# Patient Record
Sex: Male | Born: 1945 | Race: Black or African American | Hispanic: No | Marital: Single | Smoking: Never smoker
Health system: Southern US, Community
[De-identification: ages and names within clinical notes are randomized; demographics above are authoritative.]

## PROBLEM LIST (undated history)

## (undated) DIAGNOSIS — C61 Malignant neoplasm of prostate: Secondary | ICD-10-CM

## (undated) DIAGNOSIS — I10 Essential (primary) hypertension: Secondary | ICD-10-CM

## (undated) DIAGNOSIS — M545 Low back pain, unspecified: Secondary | ICD-10-CM

## (undated) HISTORY — DX: Low back pain, unspecified: M54.50

## (undated) HISTORY — PX: PROSTATE SURGERY: SHX751

## (undated) HISTORY — DX: Essential (primary) hypertension: I10

## (undated) HISTORY — DX: Malignant neoplasm of prostate: C61

## (undated) HISTORY — PX: HEMORROIDECTOMY: SUR656

---
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
## 2018-11-09 ENCOUNTER — Other Ambulatory Visit: Payer: Self-pay | Admitting: Urology

## 2018-11-09 DIAGNOSIS — C61 Malignant neoplasm of prostate: Secondary | ICD-10-CM

## 2018-12-04 ENCOUNTER — Ambulatory Visit
Admission: RE | Admit: 2018-12-04 | Discharge: 2018-12-04 | Disposition: A | Discharge status: Home / Self Care | Payer: Medicare Other | Source: Ambulatory Visit | Attending: Urology | Admitting: Urology

## 2018-12-04 ENCOUNTER — Other Ambulatory Visit: Payer: Self-pay

## 2018-12-04 DIAGNOSIS — C61 Malignant neoplasm of prostate: Secondary | ICD-10-CM

## 2018-12-04 MED ORDER — GADOBENATE DIMEGLUMINE 529 MG/ML IV SOLN
20.0000 mL | Freq: Once | INTRAVENOUS | Status: AC | PRN
  Administered 2018-12-04: 20 mL via INTRAVENOUS

## 2019-05-20 ENCOUNTER — Other Ambulatory Visit (HOSPITAL_COMMUNITY): Payer: Self-pay | Admitting: Urology

## 2019-05-20 DIAGNOSIS — C61 Malignant neoplasm of prostate: Secondary | ICD-10-CM

## 2019-06-03 ENCOUNTER — Other Ambulatory Visit: Payer: Self-pay

## 2019-06-03 ENCOUNTER — Ambulatory Visit (HOSPITAL_COMMUNITY)
Admission: RE | Admit: 2019-06-03 | Discharge: 2019-06-03 | Disposition: A | Discharge status: Home / Self Care | Payer: Medicare Other | Source: Ambulatory Visit | Attending: Urology | Admitting: Urology

## 2019-06-03 DIAGNOSIS — C61 Malignant neoplasm of prostate: Secondary | ICD-10-CM | POA: Diagnosis not present

## 2019-06-03 IMAGING — CT NM PET SKULL BASE TO THIGH
8 series · 25 of 25 positions shown · non-contrast
Comparison: Prostate MRI [DATE]

CLINICAL DATA: Prostate carcinoma with biochemical recurrence.

EXAM:
NUCLEAR MEDICINE PET SKULL BASE TO THIGH
TECHNIQUE: 10.21 mCi F-18 Fluciclovine was injected intravenously. Full-ring
PET imaging was performed from the skull base to thigh after the
radiotracer. CT data was obtained and used for attenuation
correction and anatomic localization.

[Series 3: pet sk_thigh ac · axial · 5.0mm · 4.07mm/px · z∈[+388,+1412]mm · 6 of 257 slices shown]
[im 1/257]
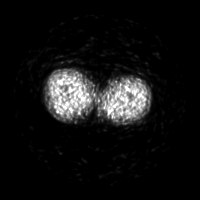
[im 52/257]
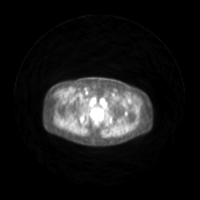
[im 103/257]
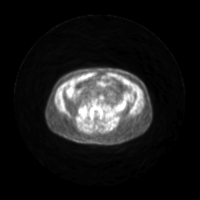
[im 154/257]
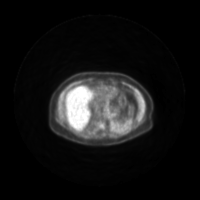
[im 205/257]
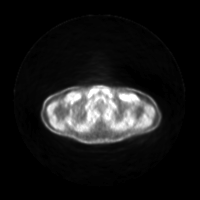
[im 257/257]
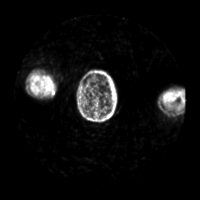

[Series 4: ct sk_thigh 5.0 hd_fov · axial · 5.0mm · 1.27mm/px · z∈[+388,+1412]mm · 5 of 257 slices shown]
[im 1/257]
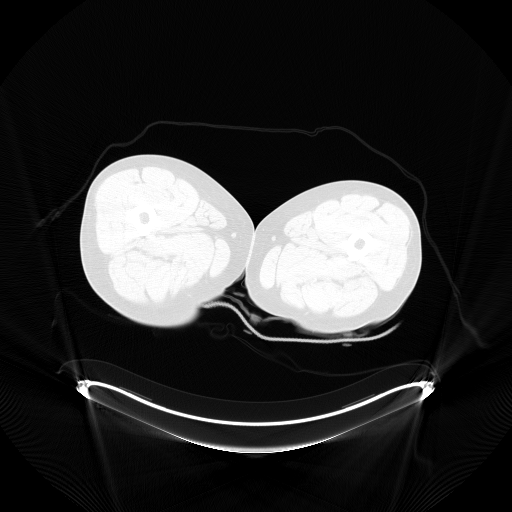
[im 65/257]
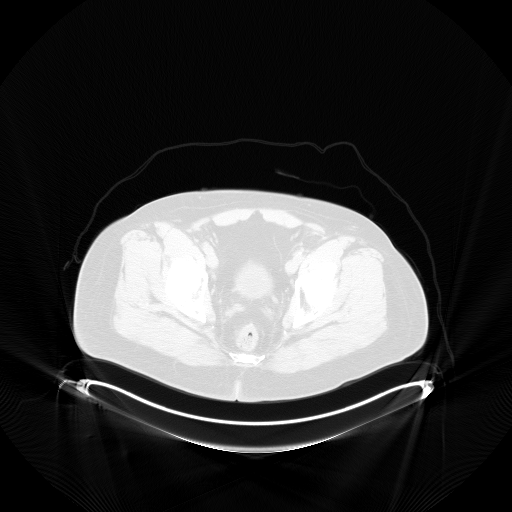
[im 129/257]
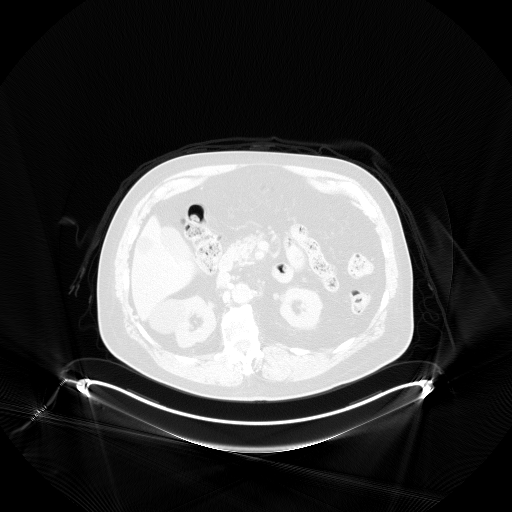
[im 193/257]
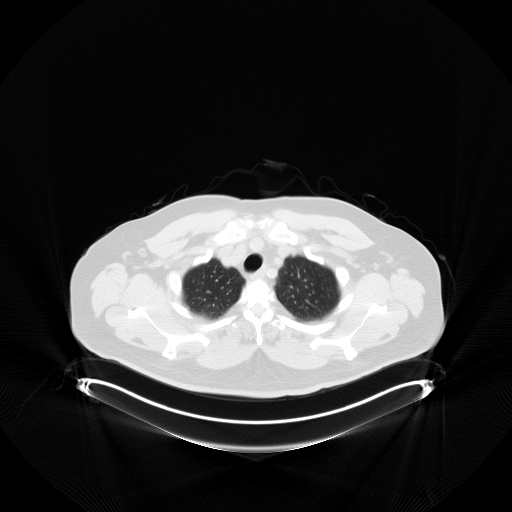
[im 257/257]
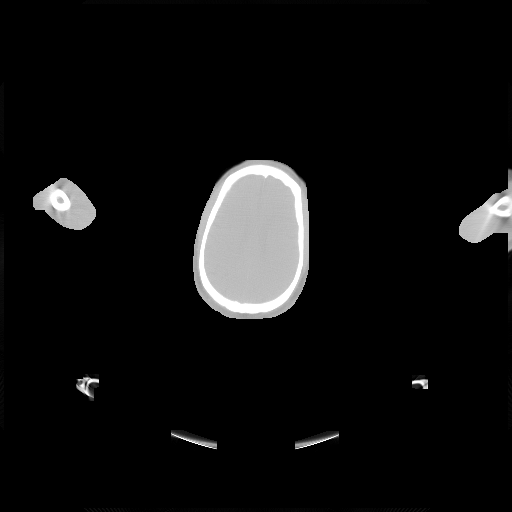

[Series 5: pet sk_thigh nac · axial · 5.0mm · 4.07mm/px · z∈[+388,+1412]mm · 5 of 257 slices shown]
[im 1/257]
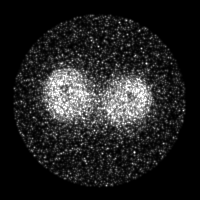
[im 65/257]
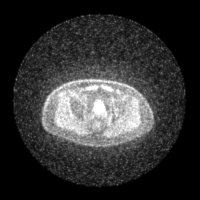
[im 129/257]
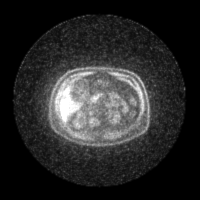
[im 193/257]
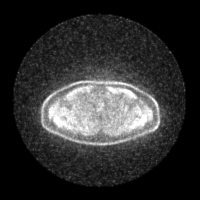
[im 257/257]
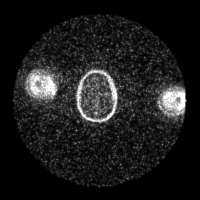

[Series 8: ct sk_thigh 5.0 (id) lung_bone · axial · 5.0mm · 0.70mm/px · 1 of 65 slices shown]
[im 1/65  bone]
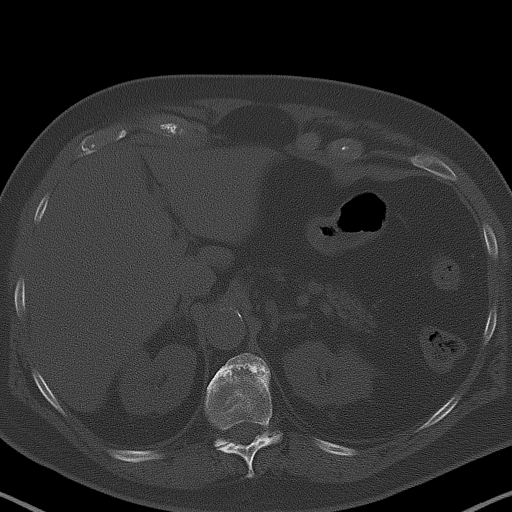

[Series 603: mip range 3 · coronal · 2.12mm/px · 1 of 32 slices shown]
[im 1/32]
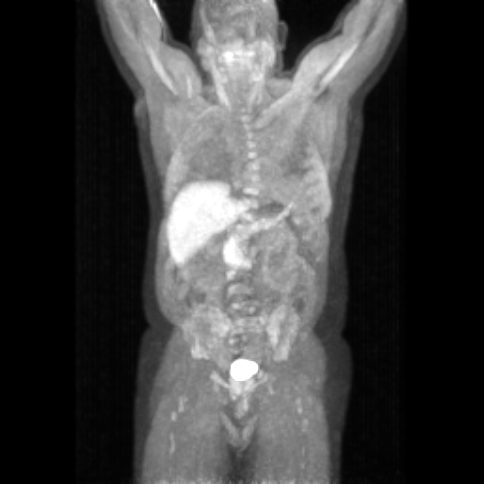

[Series 604: range-ct sk_thigh 5.0 hd_fov-cor-<alpha range> · 1 of 57 slices shown]
[im 1/57]
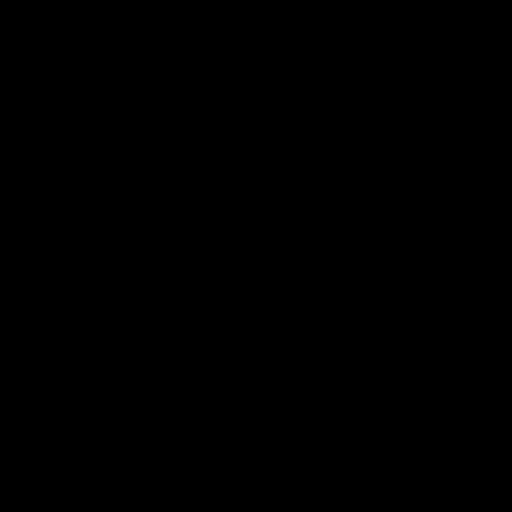

[Series 605: range-ct sk_thigh 5.0 hd_fov-tra-<alpha range> · 5 of 248 slices shown]
[im 1/248]
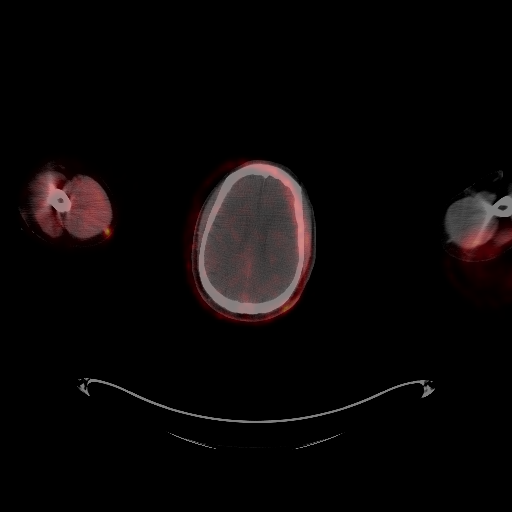
[im 62/248]
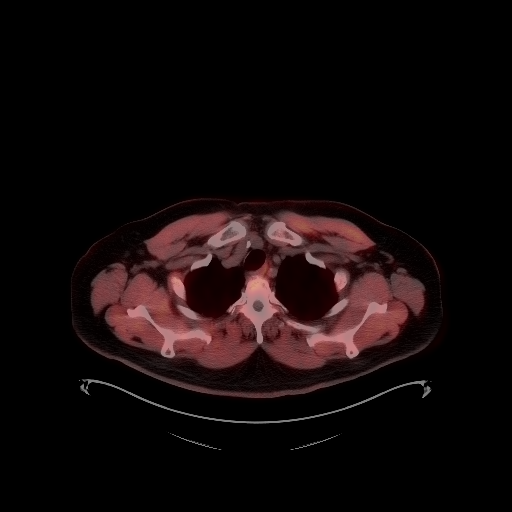
[im 124/248]
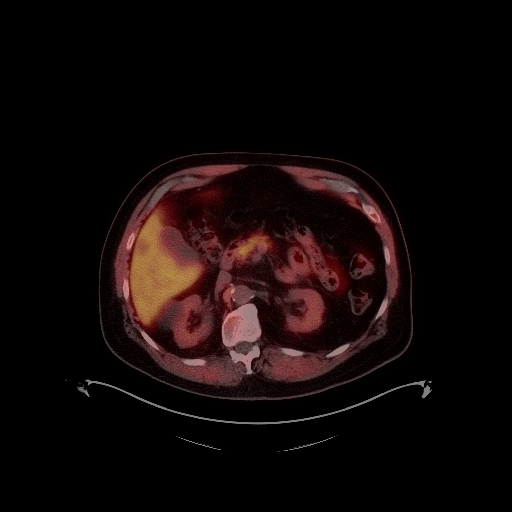
[im 186/248]
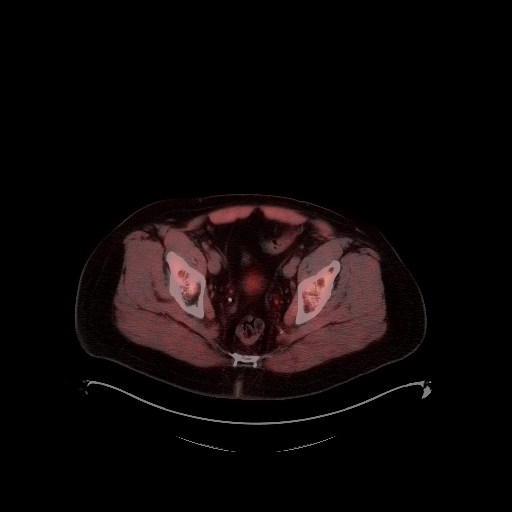
[im 248/248]
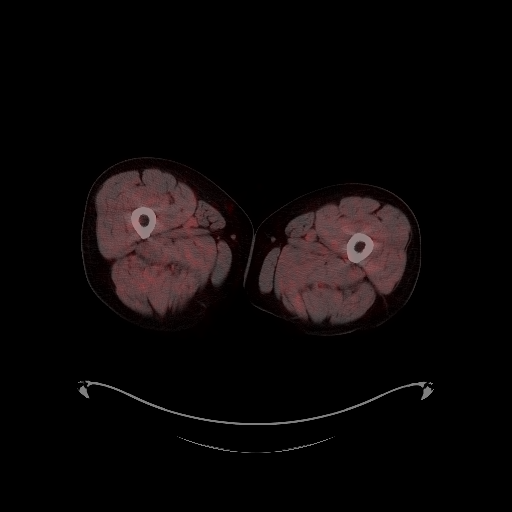

[Series 1100: results mm oncology reading · 1.3mm · 1.16mm/px · 1 of 2 slices shown]
[im 1/2]
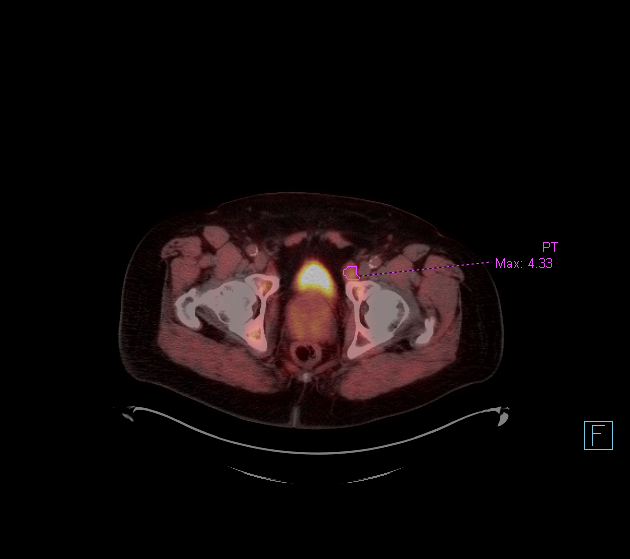

[25 of 25 positions shown; findings below may reference images not displayed]

FINDINGS: NECK

No radiotracer activity in neck lymph nodes.

Incidental CT finding: None

CHEST

No radiotracer accumulation within mediastinal or hilar lymph nodes.
No suspicious pulmonary nodules on the CT scan.

Incidental CT finding: There is atelectasis in the LEFT lower lobe.
Chronic elevation of the LEFT hemidiaphragm.

ABDOMEN/PELVIS

Prostate: No clear focal increased radiotracer activity in the
prostate gland.

Lymph nodes: The enlarged LEFT external iliac lymph node anterior to
the acetabulum measures 17 mm (image 20/14)and has relatively mild
radiotracer activity (SUV max equal 4.3). This activity is less than
background marrow activity (SUV max equal 5.4). RIGHT external iliac
lymph node measuring 12 mm (image 199/4) has similar low radiotracer
activity.

Small proximal LEFT external iliac lymph node measuring 6 mm
(185/)without significant radiotracer activity.

No abnormal activity associated small periaortic lymph nodes.

Liver: No evidence of liver metastasis. Benign cyst in the inferior
RIGHT hepatic lobe

Incidental CT finding: Prostate is enlarged measuring 16 mm in
diameter.

SKELETON

No focal  activity to suggest skeletal metastasis.
IMPRESSION: 1. No convincing evidence prostate carcinoma.
2. Mild radiotracer activity associated external iliac lymph nodes
is favored reactive.
3. No evidence of metastatic adenopathy in the abdomen.
4. No liver metastasis or pulmonary metastasis.
5. Benign hepatic cyst and renal cysts.
6. No skeletal metastasis.

## 2019-06-03 MED ORDER — AXUMIN (FLUCICLOVINE F 18) INJECTION
10.2100 | Freq: Once | INTRAVENOUS | Status: AC
  Administered 2019-06-03: 10.21 via INTRAVENOUS

## 2019-09-16 ENCOUNTER — Encounter: Payer: Self-pay | Admitting: Physical Therapy

## 2019-09-16 ENCOUNTER — Other Ambulatory Visit: Payer: Self-pay

## 2019-09-16 ENCOUNTER — Ambulatory Visit: Payer: Medicare Other | Attending: Neurology | Admitting: Physical Therapy

## 2019-09-16 DIAGNOSIS — M545 Low back pain: Secondary | ICD-10-CM | POA: Insufficient documentation

## 2019-09-16 DIAGNOSIS — Z9181 History of falling: Secondary | ICD-10-CM | POA: Insufficient documentation

## 2019-09-16 DIAGNOSIS — G8929 Other chronic pain: Secondary | ICD-10-CM | POA: Diagnosis present

## 2019-09-16 DIAGNOSIS — R252 Cramp and spasm: Secondary | ICD-10-CM | POA: Insufficient documentation

## 2019-09-16 DIAGNOSIS — M6281 Muscle weakness (generalized): Secondary | ICD-10-CM | POA: Diagnosis not present

## 2019-09-16 NOTE — Therapy (Signed)
[address] [place], [place], [address] Phone: [phone]   Fax:  [phone]  Physical Therapy Evaluation  Patient Details  Name: Frederick Kemp. [place] MRN: [id number] Date of Birth: November 05, 1945 Referring Provider (PT): Nolene Ebbs   Encounter Date: 09/16/2019  PT End of Session - 09/16/19 1610    Visit Number  1    Date for PT Re-Evaluation  11/16/19    PT Start Time  [id number]    PT Stop Time  1530    PT Time Calculation (min)  45 min    Activity Tolerance  Patient tolerated treatment well    Behavior During Therapy  [facility] for tasks assessed/performed       Past Medical History:  Diagnosis Date  . HTN (hypertension)   . Low back pain   . Prostate cancer [facility])     Past Surgical History:  Procedure Laterality Date  . HEMORROIDECTOMY    . PROSTATE SURGERY      There were no vitals filed for this visit.   Subjective Assessment - 09/16/19 1446    Subjective  Pt reports that in early April he was doing his speed walking routine and his LLE was not keeping up with RLE. Pt had MRI which showed two strokes present; since this incident pt notes that he feels that he is weaker on L side. Pt reports he is moving slower, getting up slower, and feeling off balance.    Pertinent History  HTN    Limitations  Lifting;Standing;Walking;House hold activities    Currently in Pain?  Yes    Pain Score  8     Pain Location  Back    Pain Orientation  Right;Lower    Pain Descriptors / Indicators  Aching;Radiating    Pain Type  Chronic pain    Pain Radiating Towards  R LE    Pain Onset  More than a month ago    Pain Frequency  Intermittent    Aggravating Factors   bending, lifting, standing    Pain Relieving Factors  heat, chiropractor         [facility] PT Assessment - 09/16/19 0001      Assessment   Medical Diagnosis  CVA with L sided weakness    Referring Provider (PT)  Nolene Ebbs    Onset Date/Surgical Date   07/05/19    Prior Therapy  None      Precautions   Precautions  None      Restrictions   Weight Bearing Restrictions  No      Balance Screen   Has the patient fallen in the past 6 months  Yes    How many times?  1   reaching to get something out of bed and fell on the floor   Has the patient had a decrease in activity level because of a fear of falling?   Yes    Is the patient reluctant to leave their home because of a fear of falling?   No      Home Environment   Additional Comments  no stairs; reports no problems with stairs      Prior Function   Level of Independence  Independent    Leisure  walking      Sensation   Additional Comments  mildly impaired sense of L v R      Coordination   Gross Motor Movements are Fluid and Coordinated  No   mildly  impaired coordination on L     Functional Tests   Functional tests  Sit to Stand      Sit to Stand   Comments  leaning slightly on RLE      Tone   Assessment Location  --   no tone noted     ROM / Strength   AROM / PROM / Strength  AROM;Strength      AROM   Overall AROM Comments  WFL LE/UE      Strength   Overall Strength Comments  LLE strength 4-/5 globally; RLE strength 5/5 globally; LUE strength 4/5 RUE strength 5/5      Flexibility   Soft Tissue Assessment /Muscle Length  yes    Hamstrings  very limited; pt reports cramping    Quadriceps  limited and painful      Special Tests   Other special tests  negative for clonus B      Ambulation/Gait   Gait Comments  some veering to L when walking with gait                  Objective measurements completed on examination: See above findings.      [facility] Adult PT Treatment/Exercise - 09/16/19 0001      Exercises   Exercises  Lumbar;Knee/Hip      Lumbar Exercises: Stretches   Active Hamstring Stretch  Right;Left;1 rep;30 seconds      Lumbar Exercises: Aerobic   Nustep  L5 x 10 min      Lumbar Exercises: Standing   Heel Raises  10 reps     Other Standing Lumbar Exercises  wall pushup x10      Knee/Hip Exercises: Standing   Hip Abduction  Stengthening;Both;1 set;10 reps      Knee/Hip Exercises: Seated   Marching  Both;1 set;10 reps             PT Education - 09/16/19 1609    Education Details  Pt educated on condition, rehab process, and HEP    Person(s) Educated  Patient    Methods  Explanation;Demonstration;Handout    Comprehension  Verbalized understanding;Returned demonstration       PT Short Term Goals - 09/16/19 1636      PT SHORT TERM GOAL #1   Title  Pt will be independent with HEP    Time  2    Period  Weeks    Status  New    Target Date  09/30/19        PT Long Term Goals - 09/16/19 1636      PT LONG TERM GOAL #1   Title  Pt will demonstrate LLE MMT equivalent to RLE MMT    Time  8    Status  New    Target Date  11/11/19      PT LONG TERM GOAL #2   Title  Pt will demonstrate ability to walk 1 lap around building with no deviation to L    Time  8    Period  Weeks    Status  New    Target Date  11/11/19      PT LONG TERM GOAL #3   Title  Pt will demonstrate B LE flexibility WFL to reduce reports of cramping    Time  8    Period  Weeks    Status  New    Target Date  11/11/19      PT LONG TERM GOAL #4   Title  Pt will demonstrate LUE MMT strength equivalent to R UE    Time  8    Period  Weeks    Status  New    Target Date  11/11/19             Plan - 09/16/19 1622    Clinical Impression Statement  Pt presents to clinic with L sided weakness following stroke in April 2021. Pt demonstrates mildly impaired sensation/proprioception on L, UE/LE muscle weakness on L, balance deficits, and veering to L with gait. Pt reports feeling off balance since stroke and would like to get back to walking program. Pt would benefit from skilled PT to address the above impairments.    Personal Factors and Comorbidities  Age;Comorbidity 1    Comorbidities  HTN    Examination-Activity  Limitations  Bend;Carry;Lift;Stairs;Squat;Sit;Transfers    Examination-Participation Restrictions  Community Activity    Stability/Clinical Decision Making  Evolving/Moderate complexity    Clinical Decision Making  Low    Rehab Potential  Good    PT Frequency  1x / week    PT Duration  8 weeks    PT Treatment/Interventions  ADLs/Self Care Home Management;Cryotherapy;Electrical Stimulation;Iontophoresis 4mg /ml Dexamethasone;Gait training;Stair training;Moist Heat;Functional mobility training;Therapeutic activities;Therapeutic exercise;Balance training;Neuromuscular re-education;Manual techniques;Patient/family education;Passive range of motion;Dry needling    PT Next Visit Plan  Initiate LE strengthening/flexibility, balance and gait training, manual as indicated    PT Home Exercise Plan  standing hip abduction/heel raises/marching, supine bridges, hamstring stretch, wall pushup    Consulted and Agree with Plan of Care  Patient       Patient will benefit from skilled therapeutic intervention in order to improve the following deficits and impairments:  Abnormal gait, Decreased coordination, Decreased range of motion, Difficulty walking, Decreased safety awareness, Increased muscle spasms, Decreased activity tolerance, Pain, Decreased balance, Impaired flexibility, Hypomobility, Decreased mobility, Decreased strength, Impaired sensation  Visit Diagnosis: Muscle weakness (generalized) - Plan: PT plan of care cert/re-cert  Chronic bilateral low back pain without sciatica - Plan: PT plan of care cert/re-cert  Cramp and spasm - Plan: PT plan of care cert/re-cert  History of falling - Plan: PT plan of care cert/re-cert     Problem List There are no problems to display for this patient.  Amador Cunas, PT, DPT Donald Prose Aiyana Stegmann 09/16/2019, 5:01 PM  [address] [place], [place], [address] Phone: [phone]   Fax:   [phone]  Name: Ioan Standberry. Deutscher MRN: [id number] Date of Birth: August 04, 1945

## 2019-09-27 ENCOUNTER — Ambulatory Visit: Payer: Medicare Other | Admitting: Physical Therapy

## 2019-09-27 ENCOUNTER — Other Ambulatory Visit: Payer: Self-pay

## 2019-09-27 ENCOUNTER — Encounter: Payer: Self-pay | Admitting: Physical Therapy

## 2019-09-27 DIAGNOSIS — G8929 Other chronic pain: Secondary | ICD-10-CM

## 2019-09-27 DIAGNOSIS — Z9181 History of falling: Secondary | ICD-10-CM

## 2019-09-27 DIAGNOSIS — R252 Cramp and spasm: Secondary | ICD-10-CM

## 2019-09-27 DIAGNOSIS — M6281 Muscle weakness (generalized): Secondary | ICD-10-CM | POA: Diagnosis not present

## 2019-09-27 NOTE — Therapy (Signed)
[address] [place], [place], [address] Phone: [phone]   Fax:  [phone]  Physical Therapy Treatment  Patient Details  Name: Frederick Kemp MRN: [id number] Date of Birth: 09/21/1945 Referring Provider (PT): Nolene Ebbs   Encounter Date: 09/27/2019  PT End of Session - 09/27/19 1343    Visit Number  2    Date for PT Re-Evaluation  11/16/19    PT Start Time  1300    PT Stop Time  1343    PT Time Calculation (min)  43 min    Activity Tolerance  Patient tolerated treatment well    Behavior During Therapy  [facility] for tasks assessed/performed       Past Medical History:  Diagnosis Date  . HTN (hypertension)   . Low back pain   . Prostate cancer [facility])     Past Surgical History:  Procedure Laterality Date  . HEMORROIDECTOMY    . PROSTATE SURGERY      There were no vitals filed for this visit.  Subjective Assessment - 09/27/19 1300    Subjective  Went out to a social event Saturday, he noticed he had problems with stairs.    Pertinent History  HTN    Limitations  Lifting;Standing;Walking;House hold activities    Currently in Pain?  Yes    Pain Score  3     Pain Location  Back   when he walks                       [facility] Adult PT Treatment/Exercise - 09/27/19 0001      Lumbar Exercises: Aerobic   Nustep  L5 x 21min      Lumbar Exercises: Seated   Sit to Stand  20 reps   elevated UBE seat      Knee/Hip Exercises: Machines for Strengthening   Cybex Knee Extension  10lb 2x10, LLE 5lb 2x10     Cybex Knee Flexion  35lb 2x10, LLE 20lb 2x10       Knee/Hip Exercises: Standing   Heel Raises  Both;2 sets;10 reps;2 seconds    Forward Step Up  Both;1 set;10 reps;Hand Hold: 0;Step Height: 6"               PT Short Term Goals - 09/16/19 1636      PT SHORT TERM GOAL #1   Title  Pt will be independent with HEP    Time  2    Period  Weeks    Status  New    Target  Date  09/30/19        PT Long Term Goals - 09/16/19 1636      PT LONG TERM GOAL #1   Title  Pt will demonstrate LLE MMT equivalent to RLE MMT    Time  8    Status  New    Target Date  11/11/19      PT LONG TERM GOAL #2   Title  Pt will demonstrate ability to walk 1 lap around building with no deviation to L    Time  8    Period  Weeks    Status  New    Target Date  11/11/19      PT LONG TERM GOAL #3   Title  Pt will demonstrate B LE flexibility WFL to reduce reports of cramping    Time  8    Period  Weeks  Status  New    Target Date  11/11/19      PT LONG TERM GOAL #4   Title  Pt will demonstrate LUE MMT strength equivalent to R UE    Time  8    Period  Weeks    Status  New    Target Date  11/11/19            Plan - 09/27/19 1344    Clinical Impression Statement  Pt tolerated an initial progression to TE well. Some lateral shift to his R noted with sit to stand. Decrease anterior lean with step ups on the L side. Visible L quad shaking noted with SL extensions. No reports of increase pain during the session    Personal Factors and Comorbidities  Age;Comorbidity 1    Comorbidities  HTN    Examination-Activity Limitations  Bend;Carry;Lift;Stairs;Squat;Sit;Transfers    Stability/Clinical Decision Making  Evolving/Moderate complexity    Rehab Potential  Good    PT Frequency  1x / week    PT Duration  8 weeks    PT Next Visit Plan  Initiate LE strengthening/flexibility, balance and gait training, manual as indicated       Patient will benefit from skilled therapeutic intervention in order to improve the following deficits and impairments:  Abnormal gait, Decreased coordination, Decreased range of motion, Difficulty walking, Decreased safety awareness, Increased muscle spasms, Decreased activity tolerance, Pain, Decreased balance, Impaired flexibility, Hypomobility, Decreased mobility, Decreased strength, Impaired sensation  Visit Diagnosis: Chronic bilateral low  back pain without sciatica  Cramp and spasm  History of falling  Muscle weakness (generalized)     Problem List There are no problems to display for this patient.   Scot Jun, PTA 09/27/2019, 1:49 PM  [address] [place], [place], [address] Phone: [phone]   Fax:  [phone]  Name: Frederick Kemp MRN: [id number] Date of Birth: 03-24-1946

## 2019-10-05 ENCOUNTER — Other Ambulatory Visit: Payer: Self-pay

## 2019-10-05 ENCOUNTER — Encounter: Payer: Self-pay | Admitting: Physical Therapy

## 2019-10-05 ENCOUNTER — Ambulatory Visit: Payer: Medicare Other | Attending: Neurology | Admitting: Physical Therapy

## 2019-10-05 DIAGNOSIS — M545 Low back pain, unspecified: Secondary | ICD-10-CM

## 2019-10-05 DIAGNOSIS — M6281 Muscle weakness (generalized): Secondary | ICD-10-CM | POA: Insufficient documentation

## 2019-10-05 DIAGNOSIS — G8929 Other chronic pain: Secondary | ICD-10-CM | POA: Diagnosis present

## 2019-10-05 DIAGNOSIS — R252 Cramp and spasm: Secondary | ICD-10-CM | POA: Insufficient documentation

## 2019-10-05 DIAGNOSIS — Z9181 History of falling: Secondary | ICD-10-CM | POA: Insufficient documentation

## 2019-10-05 NOTE — Therapy (Signed)
[address] [place], [place], [address] Phone: [phone]   Fax:  [phone]  Physical Therapy Treatment  Patient Details  Name: Frederick Kemp MRN: [id number] Date of Birth: 1945-08-20 Referring Provider (PT): Nolene Ebbs   Encounter Date: 10/05/2019  PT End of Session - 10/05/19 1342    Visit Number  3    Date for PT Re-Evaluation  11/16/19    PT Start Time  [id number]    PT Stop Time  1343    PT Time Calculation (min)  45 min    Activity Tolerance  Patient tolerated treatment well    Behavior During Therapy  [facility] for tasks assessed/performed       Past Medical History:  Diagnosis Date  . HTN (hypertension)   . Low back pain   . Prostate cancer [facility])     Past Surgical History:  Procedure Laterality Date  . HEMORROIDECTOMY    . PROSTATE SURGERY      There were no vitals filed for this visit.  Subjective Assessment - 10/05/19 1258    Subjective  Pain in his back when he wakes up, sometimes the pain goes away after he gets moving. Pt reports that he feels crooked  in the morning and has difficulty straightened up.      No pain 0/10                  [facility] Adult PT Treatment/Exercise - 10/05/19 0001      Ambulation/Gait   Stairs  Yes    Stairs Assistance  6: Modified independent (Device/Increase time)    Stair Management Technique  One rail Left;Alternating pattern    Number of Stairs  48    Height of Stairs  6      Lumbar Exercises: Aerobic   Elliptical  I5 R5 x2 min     Nustep  L5 x 6 min      Lumbar Exercises: Machines for Strengthening   Leg Press  --    Other Lumbar Machine Exercise  Rows & Lats 25lb 2x10     Other Lumbar Machine Exercise  Chest press 10lb 2x10       Lumbar Exercises: Standing   Shoulder Extension  20 reps;Both;Power Tower    Shoulder Extension Limitations  10      Knee/Hip Exercises: Machines for Strengthening   Cybex Knee Extension  LLE  5lb 2x10     Cybex Knee Flexion  LLE 20lb 2x10     Cybex Leg Press  40lb 2x10, LLE 20lb 2x10       Knee/Hip Exercises: Standing   Lateral Step Up  Both;1 set;10 reps;Hand Hold: 0;Step Height: 6"               PT Short Term Goals - 09/16/19 1636      PT SHORT TERM GOAL #1   Title  Pt will be independent with HEP    Time  2    Period  Weeks    Status  New    Target Date  09/30/19        PT Long Term Goals - 10/05/19 1333      PT LONG TERM GOAL #1   Title  Pt will demonstrate LLE MMT equivalent to RLE MMT    Status  On-going      PT LONG TERM GOAL #2   Title  Pt will demonstrate ability to walk 1 lap around building  with no deviation to L    Status  On-going      PT LONG TERM GOAL #3   Title  Pt will demonstrate B LE flexibility WFL to reduce reports of cramping    Status  On-going      PT LONG TERM GOAL #4   Title  Pt will demonstrate LUE MMT strength equivalent to R UE    Status  On-going            Plan - 10/05/19 1343    Clinical Impression Statement  Pt did well overall today, progressed to stair negotiation today's without issue. Core weakness present with shoulder extensions. Postural cues needed with seated rows and lats. Visible L quad shaking with SL on leg extensions.    Personal Factors and Comorbidities  Age;Comorbidity 1    Examination-Activity Limitations  Bend;Carry;Lift;Stairs;Squat;Sit;Transfers    Examination-Participation Restrictions  Community Activity    Stability/Clinical Decision Making  Evolving/Moderate complexity    Rehab Potential  Good    PT Frequency  1x / week    PT Duration  8 weeks    PT Treatment/Interventions  ADLs/Self Care Home Management;Cryotherapy;Electrical Stimulation;Iontophoresis 4mg /ml Dexamethasone;Gait training;Stair training;Moist Heat;Functional mobility training;Therapeutic activities;Therapeutic exercise;Balance training;Neuromuscular re-education;Manual techniques;Patient/family education;Passive range of  motion;Dry needling    PT Next Visit Plan  Initiate LE strengthening/flexibility, balance and gait training, manual as indicated       Patient will benefit from skilled therapeutic intervention in order to improve the following deficits and impairments:  Abnormal gait, Decreased coordination, Decreased range of motion, Difficulty walking, Decreased safety awareness, Increased muscle spasms, Decreased activity tolerance, Pain, Decreased balance, Impaired flexibility, Hypomobility, Decreased mobility, Decreased strength, Impaired sensation  Visit Diagnosis: Chronic bilateral low back pain without sciatica  History of falling  Cramp and spasm  Muscle weakness (generalized)     Problem List There are no problems to display for this patient.   Scot Jun 10/05/2019, 1:47 PM  [address] [place], [place], [address] Phone: [phone]   Fax:  [phone]  Name: Frederick Kemp MRN: [id number] Date of Birth: 08-04-1945

## 2019-10-12 ENCOUNTER — Other Ambulatory Visit: Payer: Self-pay

## 2019-10-12 ENCOUNTER — Encounter: Payer: Self-pay | Admitting: Physical Therapy

## 2019-10-12 ENCOUNTER — Ambulatory Visit: Payer: Medicare Other | Admitting: Physical Therapy

## 2019-10-12 DIAGNOSIS — M6281 Muscle weakness (generalized): Secondary | ICD-10-CM

## 2019-10-12 DIAGNOSIS — M545 Low back pain: Secondary | ICD-10-CM | POA: Diagnosis not present

## 2019-10-12 DIAGNOSIS — R252 Cramp and spasm: Secondary | ICD-10-CM

## 2019-10-12 DIAGNOSIS — G8929 Other chronic pain: Secondary | ICD-10-CM

## 2019-10-12 DIAGNOSIS — Z9181 History of falling: Secondary | ICD-10-CM

## 2019-10-12 NOTE — Therapy (Signed)
[address] [place], [place], [address] Phone: [phone]   Fax:  [phone]  Physical Therapy Treatment  Patient Details  Name: Frederick Kemp MRN: [id number] Date of Birth: February 05, 1946 Referring Provider (PT): Nolene Ebbs   Encounter Date: 10/12/2019  PT End of Session - 10/12/19 1144    Visit Number  4    Date for PT Re-Evaluation  11/16/19    PT Start Time  1057    PT Stop Time  1143    PT Time Calculation (min)  46 min    Activity Tolerance  Patient tolerated treatment well    Behavior During Therapy  [facility] for tasks assessed/performed       Past Medical History:  Diagnosis Date   HTN (hypertension)    Low back pain    Prostate cancer ([place])     Past Surgical History:  Procedure Laterality Date   HEMORROIDECTOMY     PROSTATE SURGERY      There were no vitals filed for this visit.  Subjective Assessment - 10/12/19 1059    Subjective  Pt reports twinge in his LB today but states he has been exercising everyday and is feeling better overall.    Currently in Pain?  Yes    Pain Score  6     Pain Location  Back    Pain Orientation  Right;Lower                        [facility] Adult PT Treatment/Exercise - 10/12/19 0001      Lumbar Exercises: Aerobic   Nustep  L5 x 8 min      Lumbar Exercises: Machines for Strengthening   Other Lumbar Machine Exercise  rows lats 35# 2x15    Other Lumbar Machine Exercise  chest press 15# 2x15      Knee/Hip Exercises: Machines for Strengthening   Cybex Knee Extension  BLE 10# 2x15    Cybex Knee Flexion  BLE 35# 2x15    Cybex Leg Press  40lb 2x10, LLE 20lb 2x10     Total Gym Leg Press  heel raises 20# 1x15      Knee/Hip Exercises: Seated   Sit to Sand  2 sets;10 reps;without UE support   with dynadisc under RLE then LLE              PT Short Term Goals - 09/16/19 1636      PT SHORT TERM GOAL #1   Title  Pt will be  independent with HEP    Time  2    Period  Weeks    Status  New    Target Date  09/30/19        PT Long Term Goals - 10/05/19 1333      PT LONG TERM GOAL #1   Title  Pt will demonstrate LLE MMT equivalent to RLE MMT    Status  On-going      PT LONG TERM GOAL #2   Title  Pt will demonstrate ability to walk 1 lap around building with no deviation to L    Status  On-going      PT LONG TERM GOAL #3   Title  Pt will demonstrate B LE flexibility WFL to reduce reports of cramping    Status  On-going      PT LONG TERM GOAL #4   Title  Pt will demonstrate LUE MMT strength  equivalent to R UE    Status  On-going            Plan - 10/12/19 1144    Clinical Impression Statement  Pt tolerated progression of TE well; some instability/weakness noted with STS when dynadisc was under RLE. Cues for reducing shoulder elevation during rows. Continue to progress LE strengthening.    PT Treatment/Interventions  ADLs/Self Care Home Management;Cryotherapy;Electrical Stimulation;Iontophoresis 4mg /ml Dexamethasone;Gait training;Stair training;Moist Heat;Functional mobility training;Therapeutic activities;Therapeutic exercise;Balance training;Neuromuscular re-education;Manual techniques;Patient/family education;Passive range of motion;Dry needling    PT Next Visit Plan  Progress LE strengthening/flexibility, balance and gait training, manual as indicated    PT Home Exercise Plan  standing hip abduction/heel raises/marching, supine bridges, hamstring stretch, wall pushup    Consulted and Agree with Plan of Care  Patient       Patient will benefit from skilled therapeutic intervention in order to improve the following deficits and impairments:  Abnormal gait, Decreased coordination, Decreased range of motion, Difficulty walking, Decreased safety awareness, Increased muscle spasms, Decreased activity tolerance, Pain, Decreased balance, Impaired flexibility, Hypomobility, Decreased mobility, Decreased  strength, Impaired sensation  Visit Diagnosis: Chronic bilateral low back pain without sciatica  History of falling  Cramp and spasm  Muscle weakness (generalized)     Problem List There are no problems to display for this patient.  Amador Cunas, PT, DPT Donald Prose Ahmoni Edge 10/12/2019, 11:47 AM  [address] [place], [place], [address] Phone: [phone]   Fax:  [phone]  Name: Frederick Kemp MRN: [id number] Date of Birth: 08/14/1945

## 2019-10-19 ENCOUNTER — Ambulatory Visit: Payer: Medicare Other | Admitting: Physical Therapy

## 2019-10-19 ENCOUNTER — Encounter: Payer: Self-pay | Admitting: Physical Therapy

## 2019-10-19 ENCOUNTER — Other Ambulatory Visit: Payer: Self-pay

## 2019-10-19 DIAGNOSIS — R252 Cramp and spasm: Secondary | ICD-10-CM

## 2019-10-19 DIAGNOSIS — G8929 Other chronic pain: Secondary | ICD-10-CM

## 2019-10-19 DIAGNOSIS — M6281 Muscle weakness (generalized): Secondary | ICD-10-CM

## 2019-10-19 DIAGNOSIS — Z9181 History of falling: Secondary | ICD-10-CM

## 2019-10-19 DIAGNOSIS — M545 Low back pain: Secondary | ICD-10-CM | POA: Diagnosis not present

## 2019-10-19 NOTE — Therapy (Signed)
[address] [place], [place], [address] Phone: [phone]   Fax:  [phone]  Physical Therapy Treatment  Patient Details  Name: Frederick Kemp. Hudnall MRN: [id number] Date of Birth: Dec 07, 1945 Referring Provider (PT): Nolene Ebbs   Encounter Date: 10/19/2019   PT End of Session - 10/19/19 1143    Visit Number 5    Date for PT Re-Evaluation 11/16/19    PT Start Time 1058    PT Stop Time 1143    PT Time Calculation (min) 45 min    Activity Tolerance Patient tolerated treatment well    Behavior During Therapy [facility] for tasks assessed/performed           Past Medical History:  Diagnosis Date  . HTN (hypertension)   . Low back pain   . Prostate cancer [facility])     Past Surgical History:  Procedure Laterality Date  . HEMORROIDECTOMY    . PROSTATE SURGERY      There were no vitals filed for this visit.   Subjective Assessment - 10/19/19 1059    Subjective "Feeling pretty good" voiced that's he has a lump on the R side of his back    Currently in Pain? Yes    Pain Score 2     Pain Location Back    Pain Orientation Right;Mid              [facility] PT Assessment - 10/19/19 0001      Strength   Overall Strength Comments LLE strength 5/5 globally; RLE strength 5/5 globally; LUE strength 5/5 RUE strength 5/5                         [facility] Adult PT Treatment/Exercise - 10/19/19 0001      Ambulation/Gait   Ambulation/Gait Yes    Assistive device None    Gait Pattern Within Functional Limits;Decreased arm swing - left    Ambulation Surface Unlevel;Outdoor;Paved    Stairs Yes    Stairs Assistance 6: Modified independent (Device/Increase time)    Stair Management Technique One rail Left;Alternating pattern    Number of Stairs 36    Height of Stairs 6    Gait Comments 1 flight then back down outside up hill       Lumbar Exercises: Aerobic   Nustep L6 x 7 min      Lumbar  Exercises: Machines for Strengthening   Other Lumbar Machine Exercise rows lats 35# 2x15    Other Lumbar Machine Exercise chest press 15# 2x15      Knee/Hip Exercises: Machines for Strengthening   Cybex Knee Extension BLE 10# 2x15, 10lb LLE x10     Cybex Knee Flexion BLE 35# 2x15; LLE 20lb 2x10    Cybex Leg Press 50lb 2x10, LLE 20lb 2x10     Total Gym Leg Press heel raises 40# 2x10                    PT Short Term Goals - 10/19/19 1104      PT SHORT TERM GOAL #1   Title Pt will be independent with HEP    Status Achieved             PT Long Term Goals - 10/19/19 1114      PT LONG TERM GOAL #1   Title Pt will demonstrate LLE MMT equivalent to RLE MMT    Status Achieved  PT LONG TERM GOAL #2   Title Pt will demonstrate ability to walk 1 lap around building with no deviation to L    Status Partially Met      PT LONG TERM GOAL #3   Title Pt will demonstrate B LE flexibility WFL to reduce reports of cramping    Status Achieved      PT LONG TERM GOAL #4   Title Pt will demonstrate LUE MMT strength equivalent to R UE    Status Achieved                 Plan - 10/19/19 1143    Clinical Impression Statement Pt has progressed meeting most goals. A little L HS weakness noted today with MMT. LLE did fatigue with SL extension. Decrease reciprocal arm swing on L side with up hill ambulation today. No reports of pain.    Personal Factors and Comorbidities Age;Comorbidity 1    Examination-Activity Limitations Bend;Carry;Lift;Stairs;Squat;Sit;Transfers    Examination-Participation Restrictions Community Activity    Stability/Clinical Decision Making Evolving/Moderate complexity    Rehab Potential Good    PT Frequency 1x / week    PT Duration 8 weeks    PT Treatment/Interventions ADLs/Self Care Home Management;Cryotherapy;Electrical Stimulation;Iontophoresis 3m/ml Dexamethasone;Gait training;Stair training;Moist Heat;Functional mobility training;Therapeutic  activities;Therapeutic exercise;Balance training;Neuromuscular re-education;Manual techniques;Patient/family education;Passive range of motion;Dry needling    PT Next Visit Plan Progress LE strengthening/flexibility, balance and gait training, manual as indicated Possible D/C next visit           Patient will benefit from skilled therapeutic intervention in order to improve the following deficits and impairments:  Abnormal gait, Decreased coordination, Decreased range of motion, Difficulty walking, Decreased safety awareness, Increased muscle spasms, Decreased activity tolerance, Pain, Decreased balance, Impaired flexibility, Hypomobility, Decreased mobility, Decreased strength, Impaired sensation  Visit Diagnosis: Chronic bilateral low back pain without sciatica  History of falling  Cramp and spasm  Muscle weakness (generalized)     Problem List There are no problems to display for this patient.   RScot Jun PTA 10/19/2019, 11:45 AM  [address] [address]Phone: [phone]  Fax:  [phone] Name: AErnan Runkles TGlausMRN: [id number] of Birth: [birth year]

## 2019-10-26 ENCOUNTER — Ambulatory Visit: Payer: Medicare Other | Admitting: Physical Therapy

## 2019-10-26 ENCOUNTER — Encounter: Payer: Self-pay | Admitting: Physical Therapy

## 2019-10-26 ENCOUNTER — Other Ambulatory Visit: Payer: Self-pay

## 2019-10-26 DIAGNOSIS — R252 Cramp and spasm: Secondary | ICD-10-CM

## 2019-10-26 DIAGNOSIS — M6281 Muscle weakness (generalized): Secondary | ICD-10-CM

## 2019-10-26 DIAGNOSIS — M545 Low back pain: Secondary | ICD-10-CM | POA: Diagnosis not present

## 2019-10-26 DIAGNOSIS — G8929 Other chronic pain: Secondary | ICD-10-CM

## 2019-10-26 DIAGNOSIS — Z9181 History of falling: Secondary | ICD-10-CM

## 2019-10-26 NOTE — Therapy (Signed)
[address] [place], [place], [address] Phone: [phone]   Fax:  [phone]  Physical Therapy Treatment  Patient Details  Name: Frederick Kemp MRN: [id number] Date of Birth: 1946/01/01 Referring Provider (PT): Nolene Ebbs   Encounter Date: 10/26/2019   PT End of Session - 10/26/19 1231    Visit Number 6    Date for PT Re-Evaluation 11/16/19    PT Start Time 2482    PT Stop Time 1230    PT Time Calculation (min) 45 min    Behavior During Therapy [facility] for tasks assessed/performed           Past Medical History:  Diagnosis Date  . HTN (hypertension)   . Low back pain   . Prostate cancer [facility])     Past Surgical History:  Procedure Laterality Date  . HEMORROIDECTOMY    . PROSTATE SURGERY      There were no vitals filed for this visit.   Subjective Assessment - 10/26/19 1144    Subjective "Good" no issue at home    Currently in Pain? No/denies                             [facility] Adult PT Treatment/Exercise - 10/26/19 0001      Lumbar Exercises: Aerobic   Elliptical I5 R5 x2 min     UBE (Upper Arm Bike) Constant work 25 watts 2 min each       Lumbar Exercises: Machines for Strengthening   Other Lumbar Machine Exercise rows lats 45# 2x10    Other Lumbar Machine Exercise chest press 20# 2x10      Lumbar Exercises: Standing   Shoulder Extension 20 reps;Both;Power Tower    Shoulder Extension Limitations 15      Knee/Hip Exercises: Machines for Strengthening   Cybex Knee Extension BLE 10# 2x15    Cybex Knee Flexion BLE 35# 2x15; LLE 20lb 2x10    Cybex Leg Press 50lb 2x15, LLE 30lb 3x5       Knee/Hip Exercises: Standing   Heel Raises Both;2 sets;15 reps;2 seconds                    PT Short Term Goals - 10/19/19 1104      PT SHORT TERM GOAL #1   Title Pt will be independent with HEP    Status Achieved             PT Long Term Goals -  10/19/19 1114      PT LONG TERM GOAL #1   Title Pt will demonstrate LLE MMT equivalent to RLE MMT    Status Achieved      PT LONG TERM GOAL #2   Title Pt will demonstrate ability to walk 1 lap around building with no deviation to L    Status Partially Met      PT LONG TERM GOAL #3   Title Pt will demonstrate B LE flexibility WFL to reduce reports of cramping    Status Achieved      PT LONG TERM GOAL #4   Title Pt will demonstrate LUE MMT strength equivalent to R UE    Status Achieved                 Plan - 10/26/19 1231    Clinical Impression Statement Most goals met. unable to ambulate around building outside due to heavy  rain. No issues reported with today's interventions. Pt reports that he is pleased with his current functional status. He reports having access to a gym and will continues to workout.    Personal Factors and Comorbidities Age;Comorbidity 1    Comorbidities HTN    Examination-Activity Limitations Bend;Carry;Lift;Stairs;Squat;Sit;Transfers    Examination-Participation Restrictions Community Activity    Stability/Clinical Decision Making Evolving/Moderate complexity    Rehab Potential Good    PT Frequency 1x / week    PT Duration 8 weeks           Patient will benefit from skilled therapeutic intervention in order to improve the following deficits and impairments:  Abnormal gait, Decreased coordination, Decreased range of motion, Difficulty walking, Decreased safety awareness, Increased muscle spasms, Decreased activity tolerance, Pain, Decreased balance, Impaired flexibility, Hypomobility, Decreased mobility, Decreased strength, Impaired sensation  Visit Diagnosis: Muscle weakness (generalized)  Cramp and spasm  History of falling  Chronic bilateral low back pain without sciatica     Problem List There are no problems to display for this patient.  PHYSICAL THERAPY DISCHARGE SUMMARY  Visits from Start of Care: 6   Plan: Patient agrees to  discharge.  Patient goals were partially met. Patient is being discharged due to being pleased with the current functional level.  ?????      Scot Jun, PTA 10/26/2019, 12:33 PM  [address] [place], [place], [address] Phone: [phone]   Fax:  [phone]  Name: Frederick Kemp MRN: [id number] Date of Birth: 21-Feb-1946

## 2019-11-09 ENCOUNTER — Ambulatory Visit: Payer: Medicare Other | Admitting: Physical Therapy

## 2021-07-12 ENCOUNTER — Other Ambulatory Visit: Payer: Self-pay | Admitting: Internal Medicine

## 2021-07-13 LAB — COMPLETE METABOLIC PANEL WITH GFR
AG Ratio: 1.8 (calc) (ref 1.0–2.5)
ALT: 24 U/L (ref 9–46)
AST: 25 U/L (ref 10–35)
Albumin: 4.3 g/dL (ref 3.6–5.1)
Alkaline phosphatase (APISO): 80 U/L (ref 35–144)
BUN/Creatinine Ratio: 14 (calc) (ref 6–22)
BUN: 19 mg/dL (ref 7–25)
CO2: 25 mmol/L (ref 20–32)
Calcium: 9.1 mg/dL (ref 8.6–10.3)
Chloride: 104 mmol/L (ref 98–110)
Creat: 1.32 mg/dL — ABNORMAL HIGH (ref 0.70–1.28)
Globulin: 2.4 g/dL (calc) (ref 1.9–3.7)
Glucose, Bld: 85 mg/dL (ref 65–99)
Potassium: 4.6 mmol/L (ref 3.5–5.3)
Sodium: 139 mmol/L (ref 135–146)
Total Bilirubin: 0.8 mg/dL (ref 0.2–1.2)
Total Protein: 6.7 g/dL (ref 6.1–8.1)
eGFR: 56 mL/min/{1.73_m2} — ABNORMAL LOW (ref >=60)

## 2021-07-13 LAB — TSH: TSH: 1.59 mIU/L (ref 0.40–4.50)

## 2021-07-13 LAB — CBC
HCT: 44.8 % (ref 38.5–50.0)
Hemoglobin: 14.5 g/dL (ref 13.2–17.1)
MCH: 27.1 pg (ref 27.0–33.0)
MCHC: 32.4 g/dL (ref 32.0–36.0)
MCV: 83.7 fL (ref 80.0–100.0)
MPV: 9.5 fL (ref 7.5–12.5)
Platelets: 325 10*3/uL (ref 140–400)
RBC: 5.35 10*6/uL (ref 4.20–5.80)
RDW: 13 % (ref 11.0–15.0)
WBC: 3.5 10*3/uL — ABNORMAL LOW (ref 3.8–10.8)

## 2021-07-13 LAB — LIPID PANEL
Cholesterol: 126 mg/dL (ref <200)
HDL: 44 mg/dL (ref >=40)
LDL Cholesterol (Calc): 65 mg/dL (calc)
Non-HDL Cholesterol (Calc): 82 mg/dL (calc) (ref <130)
Total CHOL/HDL Ratio: 2.9 (calc) (ref <5.0)
Triglycerides: 85 mg/dL (ref <150)

## 2023-11-24 ENCOUNTER — Ambulatory Visit (INDEPENDENT_AMBULATORY_CARE_PROVIDER_SITE_OTHER)

## 2023-11-24 ENCOUNTER — Ambulatory Visit: Admitting: Podiatry

## 2023-11-24 ENCOUNTER — Other Ambulatory Visit: Payer: Self-pay | Admitting: Podiatry

## 2023-11-24 ENCOUNTER — Encounter: Payer: Self-pay | Admitting: Podiatry

## 2023-11-24 DIAGNOSIS — M7751 Other enthesopathy of right foot: Secondary | ICD-10-CM

## 2023-11-24 DIAGNOSIS — B351 Tinea unguium: Secondary | ICD-10-CM

## 2023-11-24 DIAGNOSIS — M79674 Pain in right toe(s): Secondary | ICD-10-CM | POA: Diagnosis not present

## 2023-11-24 MED ORDER — TRIAMCINOLONE ACETONIDE 10 MG/ML IJ SUSP
10.0000 mg | Freq: Once | INTRAMUSCULAR | Status: AC
  Administered 2023-11-24: 10 mg via INTRA_ARTICULAR

## 2023-11-24 NOTE — Progress Notes (Signed)
 Subjective:   Patient ID: Frederick Kemp. Frederick Kemp, male   DOB: 78 y.o.   MRN: [id number]   HPI Patient presents with a lot of pain third digit right foot and states it has been present for several months.  Does not remember specific injury states he does not have diabetes and it just gets sore when he stands on it and wear shoes.  Patient does not smoke likes to be active   Review of Systems  All other systems reviewed and are negative.       Objective:  Physical Exam Vitals and nursing note reviewed.  Constitutional:      Appearance: He is well-developed.  Pulmonary:     Effort: Pulmonary effort is normal.  Musculoskeletal:        General: Normal range of motion.  Skin:    General: Skin is warm.  Neurological:     Mental Status: He is alert.     Neurovascular status found to be intact muscle strength was found to be adequate range of motion adequate with exquisite discomfort distal to phalangeal joint digit 3 right with fluid around the joint surface pain with pressure and inability to wear shoe gear with any degree of comfort.  Patient has good digital perfusion well-oriented     Assessment:  Appears to be inflammatory with probable capsulitis of the distal intra phalangeal joint digit 3 right fluid buildup around the joint with thickened toenail     Plan:  H&P reviewed sterile prep injected the distal intra phalangeal joint 2 mg dexamethasone Kenalog  5 mg Xylocaine debrided nail bed advised on wider shoes and soaks and if symptoms continue we may have to consider a different approach to this condition.  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

## 2023-12-04 ENCOUNTER — Encounter: Payer: Self-pay | Admitting: Podiatry

## 2023-12-04 ENCOUNTER — Ambulatory Visit: Admitting: Podiatry

## 2023-12-04 DIAGNOSIS — M79675 Pain in left toe(s): Secondary | ICD-10-CM

## 2023-12-04 DIAGNOSIS — M79674 Pain in right toe(s): Secondary | ICD-10-CM | POA: Diagnosis not present

## 2023-12-04 DIAGNOSIS — B351 Tinea unguium: Secondary | ICD-10-CM

## 2023-12-04 DIAGNOSIS — M2041 Other hammer toe(s) (acquired), right foot: Secondary | ICD-10-CM | POA: Diagnosis not present

## 2023-12-04 DIAGNOSIS — M21619 Bunion of unspecified foot: Secondary | ICD-10-CM | POA: Diagnosis not present

## 2023-12-04 NOTE — Progress Notes (Signed)
 Subjective:   Patient ID: Frederick Kemp. Frederick Kemp, male   DOB: 78 y.o.   MRN: [id number]   HPI Patient presents with significant problems with the third digit right with lesion still present and also has secondary nailbeds 1-5 both feet that are very thick and dystrophic and painful for him.   ROS      Objective:  Physical Exam  Neurovascular status intact muscle strength found to be adequate range of motion adequate with patient found to have structural deformity third digit right and structural bunion deformity right with discomfort.  Secondarily has elongated nailbeds 1-5 both feet that are thick and dystrophic     Assessment:  Mycotic nail infection with pain 1-5 both feet and hammertoe deformity bunion deformity right     Plan:  H&P reviewed both conditions and discussed possible digital correction bunion correction but at this point debridement accomplished of all nails and lesion right third courtesy and I want to see response and decide whether or not surgical intervention will be necessary eventually

## 2024-03-22 ENCOUNTER — Other Ambulatory Visit (HOSPITAL_COMMUNITY): Payer: Self-pay | Admitting: Urology

## 2024-03-22 DIAGNOSIS — R9721 Rising PSA following treatment for malignant neoplasm of prostate: Secondary | ICD-10-CM

## 2024-03-29 ENCOUNTER — Encounter (HOSPITAL_COMMUNITY)
Admission: RE | Admit: 2024-03-29 | Discharge: 2024-03-29 | Disposition: A | Discharge status: Home / Self Care | Source: Ambulatory Visit | Attending: Urology | Admitting: Urology

## 2024-03-29 DIAGNOSIS — R9721 Rising PSA following treatment for malignant neoplasm of prostate: Secondary | ICD-10-CM | POA: Diagnosis present

## 2024-03-29 MED ORDER — FLOTUFOLASTAT F 18 GALLIUM 296-5846 MBQ/ML IV SOLN
7.8900 | Freq: Once | INTRAVENOUS | Status: AC
  Administered 2024-03-29: 7.89 via INTRAVENOUS

## 2024-05-11 DIAGNOSIS — E041 Nontoxic single thyroid nodule: Secondary | ICD-10-CM

## 2024-05-12 ENCOUNTER — Other Ambulatory Visit (HOSPITAL_COMMUNITY): Payer: Self-pay | Admitting: Otolaryngology

## 2024-05-12 DIAGNOSIS — E041 Nontoxic single thyroid nodule: Secondary | ICD-10-CM

## 2024-05-19 ENCOUNTER — Ambulatory Visit (HOSPITAL_COMMUNITY)
Admission: RE | Admit: 2024-05-19 | Discharge: 2024-05-19 | Disposition: A | Discharge status: Home / Self Care | Source: Ambulatory Visit | Attending: Otolaryngology | Admitting: Otolaryngology

## 2024-05-19 DIAGNOSIS — E041 Nontoxic single thyroid nodule: Secondary | ICD-10-CM | POA: Insufficient documentation

## 2024-06-07 ENCOUNTER — Ambulatory Visit (HOSPITAL_COMMUNITY): Admission: RE | Admit: 2024-06-07 | Source: Ambulatory Visit

## 2024-06-17 ENCOUNTER — Ambulatory Visit (HOSPITAL_COMMUNITY)
